# Patient Record
Sex: Female | Born: 1985 | Race: Black or African American | Hispanic: No | Marital: Married | Smoking: Current some day smoker
Health system: Southern US, Community
[De-identification: ages and names within clinical notes are randomized; demographics above are authoritative.]

## PROBLEM LIST (undated history)

## (undated) DIAGNOSIS — I959 Hypotension, unspecified: Secondary | ICD-10-CM

## (undated) DIAGNOSIS — F419 Anxiety disorder, unspecified: Secondary | ICD-10-CM

## (undated) DIAGNOSIS — F3112 Bipolar disorder, current episode manic without psychotic features, moderate: Secondary | ICD-10-CM

## (undated) HISTORY — PX: MANDIBLE FRACTURE SURGERY: SHX706

## (undated) HISTORY — PX: LAPAROSCOPY: SHX197

---

## 2017-09-05 ENCOUNTER — Encounter: Payer: Self-pay | Admitting: Emergency Medicine

## 2017-09-05 ENCOUNTER — Emergency Department
Admission: EM | Admit: 2017-09-05 | Discharge: 2017-09-05 | Disposition: A | Discharge status: Home / Self Care | Payer: Medicaid Other | Attending: Emergency Medicine | Admitting: Emergency Medicine

## 2017-09-05 DIAGNOSIS — Y939 Activity, unspecified: Secondary | ICD-10-CM | POA: Diagnosis not present

## 2017-09-05 DIAGNOSIS — F17200 Nicotine dependence, unspecified, uncomplicated: Secondary | ICD-10-CM | POA: Diagnosis not present

## 2017-09-05 DIAGNOSIS — Y929 Unspecified place or not applicable: Secondary | ICD-10-CM | POA: Insufficient documentation

## 2017-09-05 DIAGNOSIS — M25511 Pain in right shoulder: Secondary | ICD-10-CM | POA: Insufficient documentation

## 2017-09-05 DIAGNOSIS — Y999 Unspecified external cause status: Secondary | ICD-10-CM | POA: Diagnosis not present

## 2017-09-05 MED ORDER — LIDOCAINE 5 % EX PTCH
2.0000 | MEDICATED_PATCH | CUTANEOUS | Status: DC
  Administered 2017-09-05: 2 via TRANSDERMAL
  Filled 2017-09-05 (×2): qty 2
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
## 2018-03-23 DIAGNOSIS — Z3A01 Less than 8 weeks gestation of pregnancy: Secondary | ICD-10-CM | POA: Insufficient documentation

## 2018-03-23 DIAGNOSIS — Z3490 Encounter for supervision of normal pregnancy, unspecified, unspecified trimester: Secondary | ICD-10-CM | POA: Diagnosis not present

## 2018-03-23 DIAGNOSIS — O9989 Other specified diseases and conditions complicating pregnancy, childbirth and the puerperium: Secondary | ICD-10-CM | POA: Insufficient documentation

## 2018-03-23 DIAGNOSIS — R1084 Generalized abdominal pain: Secondary | ICD-10-CM | POA: Insufficient documentation

## 2018-03-24 ENCOUNTER — Emergency Department
Admission: EM | Admit: 2018-03-24 | Discharge: 2018-03-24 | Disposition: A | Discharge status: Home / Self Care | Payer: Medicaid Other | Attending: Emergency Medicine | Admitting: Emergency Medicine

## 2018-03-24 ENCOUNTER — Emergency Department: Payer: Medicaid Other

## 2018-03-24 DIAGNOSIS — R1084 Generalized abdominal pain: Secondary | ICD-10-CM

## 2018-03-24 DIAGNOSIS — Z3490 Encounter for supervision of normal pregnancy, unspecified, unspecified trimester: Secondary | ICD-10-CM

## 2018-03-24 HISTORY — DX: Anxiety disorder, unspecified: F41.9

## 2018-03-24 HISTORY — DX: Hypotension, unspecified: I95.9

## 2018-03-24 HISTORY — DX: Bipolar disorder, current episode manic without psychotic features, moderate: F31.12

## 2018-03-24 LAB — CBC
HEMATOCRIT: 36 % (ref 35.0–47.0)
Hemoglobin: 12.2 g/dL (ref 12.0–16.0)
MCH: 27.7 pg (ref 26.0–34.0)
MCHC: 33.8 g/dL (ref 32.0–36.0)
MCV: 82.1 fL (ref 80.0–100.0)
PLATELETS: 369 10*3/uL (ref 150–440)
RBC: 4.39 MIL/uL (ref 3.80–5.20)
RDW: 17.2 % — AB (ref 11.5–14.5)
WBC: 13.3 10*3/uL — ABNORMAL HIGH (ref 3.6–11.0)

## 2018-03-24 LAB — HCG, QUANTITATIVE, PREGNANCY: HCG, BETA CHAIN, QUANT, S: 31146 m[IU]/mL — AB (ref <5)

## 2018-03-24 LAB — COMPREHENSIVE METABOLIC PANEL
ALBUMIN: 3.8 g/dL (ref 3.5–5.0)
ALT: 22 U/L (ref 14–54)
AST: 27 U/L (ref 15–41)
Alkaline Phosphatase: 63 U/L (ref 38–126)
Anion gap: 9 (ref 5–15)
BUN: 14 mg/dL (ref 6–20)
CHLORIDE: 103 mmol/L (ref 101–111)
CO2: 20 mmol/L — AB (ref 22–32)
CREATININE: 0.85 mg/dL (ref 0.44–1.00)
Calcium: 9.2 mg/dL (ref 8.9–10.3)
GFR calc Af Amer: >60 mL/min (ref >60)
GLUCOSE: 106 mg/dL — AB (ref 65–99)
Potassium: 3.8 mmol/L (ref 3.5–5.1)
Sodium: 132 mmol/L — ABNORMAL LOW (ref 135–145)
TOTAL PROTEIN: 7.5 g/dL (ref 6.5–8.1)
Total Bilirubin: 0.5 mg/dL (ref 0.3–1.2)

## 2018-03-24 LAB — URINALYSIS, COMPLETE (UACMP) WITH MICROSCOPIC
BACTERIA UA: NONE SEEN
Bilirubin Urine: NEGATIVE
Glucose, UA: NEGATIVE mg/dL
Hgb urine dipstick: NEGATIVE
Ketones, ur: NEGATIVE mg/dL
Leukocytes, UA: NEGATIVE
NITRITE: NEGATIVE
Protein, ur: NEGATIVE mg/dL
SPECIFIC GRAVITY, URINE: 1.025 (ref 1.005–1.030)
pH: 6 (ref 5.0–8.0)

## 2018-03-24 LAB — POCT PREGNANCY, URINE: PREG TEST UR: POSITIVE — AB

## 2018-03-24 LAB — LIPASE, BLOOD: LIPASE: 31 U/L (ref 11–51)

## 2018-03-24 NOTE — Discharge Instructions (Signed)
As we discussed, your work-up today was reassuring.  Your ultrasound demonstrated a single intrauterine pregnancy at about 6 weeks and 2 days gestation with no other acute abnormalities identified.  We do not have a specific explanation for the pain you are experiencing or why it feels better when you are up and walking around, but we encourage you to follow-up with your OB/GYN today for an appointment at the next available opportunity.  We provided a CD for you with the ultrasound that you can take to your appointment.  Return to the emergency department if you develop new or worsening symptoms that concern you.

## 2018-03-24 NOTE — ED Triage Notes (Signed)
Patient c/o lower abdominal pain, nausea, and abdominal distention X 2 days. Patient reports she is approx [redacted] weeks pregnant. Patient reports hx of ruptured ectopic tubes.

## 2018-03-24 NOTE — ED Provider Notes (Signed)
[facility] Emergency Department Provider Note  ____________________________________________   First MD Initiated Contact with Patient 03/24/18 0221     (approximate)  I have reviewed the triage vital signs and the nursing notes.   HISTORY  Chief Complaint Abdominal Pain    HPI Kristen Watson is a 32 y.o. female G4P3 at about [redacted] weeks gestation who presents for evaluation of abdominal pain, nausea and subjective abdominal distention for about 2 days.  She reports that she is establishing care with [facility] but because she has a history of ruptured tube due to ectopic pregnancy they told her by phone today that she should come to the emergency department to be evaluated.  She reports that the pain is severe and generalized and waxes and wanes in intensity.  It feels somewhat like a cramp but she is also worried about the abdominal distention.  She feels better when she ambulates and all throughout the history and physical the patient is pacing back and forth in the exam room.  She states that sitting or lying down makes it feel worse.  It is not related to eating.  She has had nausea but no vomiting, denies dysuria, denies chest pain or shortness of breath.  She had similar but much milder symptoms with her other pregnancies.  She reports it has been gradual in onset but is currently severe although as stated above, ambulation makes the symptoms tolerable.  She is taken some children's Tylenol but states that it did not make it feel better.  She has had no vaginal bleeding.   Past Medical History:  Diagnosis Date  . Anxiety   . Bipolar 1 disorder with moderate mania (HCC)   . Hypotension     There are no active problems to display for this patient.   Past Surgical History:  Procedure Laterality Date  . LAPAROSCOPY    . MANDIBLE FRACTURE SURGERY      Prior to Admission medications   Not on File    Allergies Patient has no known allergies.  No  family history on file.  Social History Social History   Tobacco Use  . Smoking status: Current Some Day Smoker  . Smokeless tobacco: Never Used  Substance Use Topics  . Alcohol use: Yes  . Drug use: No    Review of Systems Constitutional: No fever/chills Eyes: No visual changes. ENT: No sore throat. Cardiovascular: Denies chest pain. Respiratory: Denies shortness of breath. Gastrointestinal: Generalized abdominal pain and distention as described above Genitourinary: Negative for dysuria.  No vaginal bleeding Musculoskeletal: Negative for neck pain.  Negative for back pain. Integumentary: Negative for rash. Neurological: Negative for headaches, focal weakness or numbness.   ____________________________________________   PHYSICAL EXAM:  VITAL SIGNS: ED Triage Vitals  Enc Vitals Group     BP 03/24/18 0001 (!) 145/73     Pulse Rate 03/24/18 0001 (!) 107     Resp 03/24/18 0001 18     Temp 03/24/18 0001 98.7 F (37.1 C)     Temp Source 03/24/18 0001 Oral     SpO2 03/24/18 0001 99 %     Weight 03/24/18 0001 (!) 136.8 kg (301 lb 9.4 oz)     Height 03/24/18 0001 1.753 m ( )     Head Circumference --      Peak Flow --      Pain Score 03/24/18 0011 8     Pain Loc --      Pain Edu? --  Excl. in GC? --     Constitutional: Alert and oriented.  Generally well-appearing but anxious and pacing the room Eyes: Conjunctivae are normal.  Head: Atraumatic. Nose: No congestion/rhinnorhea. Mouth/Throat: Mucous membranes are moist. Neck: No stridor.  No meningeal signs.   Cardiovascular: Normal rate, regular rhythm. Good peripheral circulation. Grossly normal heart sounds. Respiratory: Normal respiratory effort.  No retractions. Lungs CTAB. Gastrointestinal: Obese.  Soft with some tenderness to palpation just above the umbilicus but no palpable hernia.  No rebound and no guarding Genitourinary: Deferred Musculoskeletal: No lower extremity tenderness nor edema. No gross  deformities of extremities. Neurologic:  Normal speech and language. No gross focal neurologic deficits are appreciated.  Skin:  Skin is warm, dry and intact. No rash noted.  ____________________________________________   LABS (all labs ordered are listed, but only abnormal results are displayed)  Labs Reviewed  COMPREHENSIVE METABOLIC PANEL - Abnormal; Notable for the following components:      Result Value   Sodium 132 (*)    CO2 20 (*)    Glucose, Bld 106 (*)    All other components within normal limits  CBC - Abnormal; Notable for the following components:   WBC 13.3 (*)    RDW 17.2 (*)    All other components within normal limits  URINALYSIS, COMPLETE (UACMP) WITH MICROSCOPIC - Abnormal; Notable for the following components:   Color, Urine YELLOW (*)    APPearance CLEAR (*)    All other components within normal limits  HCG, QUANTITATIVE, PREGNANCY - Abnormal; Notable for the following components:   hCG, Beta Chain, Quant, S 31,146 (*)    All other components within normal limits  POCT PREGNANCY, URINE - Abnormal; Notable for the following components:   Preg Test, Ur POSITIVE (*)    All other components within normal limits  LIPASE, BLOOD  POC URINE PREG, ED   ____________________________________________  EKG  None - EKG not ordered by ED physician ____________________________________________  RADIOLOGY   ED MD interpretation: Radiologist visualizes a single intrauterine pregnancy at about 6 weeks and 2 days gestation with no other acute abnormalities identified  Official radiology report(s): US Ob Comp Less 14 Wks  Result Date: 03/24/2018 CLINICAL DATA:  Lower abdominal pain for 2 days EXAM: OBSTETRIC <14 WK [place] AND TRANSVAGINAL OB US TECHNIQUE: Both transabdominal and transvaginal ultrasound examinations were performed for complete evaluation of the gestation as well as the maternal uterus, adnexal regions, and pelvic cul-de-sac. Transvaginal technique was  performed to assess early pregnancy. COMPARISON:  None. FINDINGS: Intrauterine gestational sac: Single intrauterine gestational sac Yolk sac:  Visible Embryo:  Visible Cardiac Activity: Visible Heart Rate: 121 bpm CRL: 5.3 mm   6 w   2 d                  [place] EDC: 11/15/2018 Subchorionic hemorrhage:  None visualized. Maternal uterus/adnexae: Left ovary is within normal limits and measures 4.3 x 2.2 x 3 cm. The right ovary measures 4.1 x 2.6 x 2.3 cm. Hypoechoic cyst with echogenic rim in the right ovary measuring 2 cm, probable corpus luteal cyst. No significant free fluid. IMPRESSION: Single viable intrauterine pregnancy as above. No other specific abnormality is seen Electronically Signed   By: Jasmine Pang M.D.   On: 03/24/2018 01:46   US Ob Transvaginal  Result Date: 03/24/2018 CLINICAL DATA:  Lower abdominal pain for 2 days EXAM: OBSTETRIC <14 WK [place] AND TRANSVAGINAL OB US TECHNIQUE: Both transabdominal and transvaginal ultrasound examinations were performed for  complete evaluation of the gestation as well as the maternal uterus, adnexal regions, and pelvic cul-de-sac. Transvaginal technique was performed to assess early pregnancy. COMPARISON:  None. FINDINGS: Intrauterine gestational sac: Single intrauterine gestational sac Yolk sac:  Visible Embryo:  Visible Cardiac Activity: Visible Heart Rate: 121 bpm CRL: 5.3 mm   6 w   2 d                  [place] EDC: 11/15/2018 Subchorionic hemorrhage:  None visualized. Maternal uterus/adnexae: Left ovary is within normal limits and measures 4.3 x 2.2 x 3 cm. The right ovary measures 4.1 x 2.6 x 2.3 cm. Hypoechoic cyst with echogenic rim in the right ovary measuring 2 cm, probable corpus luteal cyst. No significant free fluid. IMPRESSION: Single viable intrauterine pregnancy as above. No other specific abnormality is seen Electronically Signed   By: Jasmine Pang M.D.   On: 03/24/2018 01:46    ____________________________________________   PROCEDURES  Critical Care  performed: No   Procedure(s) performed:   Procedures   ____________________________________________   INITIAL IMPRESSION / ASSESSMENT AND PLAN / ED COURSE  As part of my medical decision making, I reviewed the following data within the electronic MEDICAL RECORD NUMBER Nursing notes reviewed and incorporated, Labs reviewed  and Old chart reviewed    Differential diagnosis includes but is not limited to any standard cause of intra-abdominal pain such as appendicitis, biliary colic, renal colic, diverticulitis, etc., as well as pregnancy related pain such as round ligament pain, ectopic pregnancy,  Spontaneous abortion, etc.  The fact that the patient feels much better when she is up moving around is somewhat puzzling to me but in my mind is reassuring because acute intra-abdominal infections should not be relieved with additional movement and ambulation.  Her pain is very localized to an area just above her umbilicus and I think unlikely to be the result of renal colic/kidney stones, and her urinalysis is reassuring with no acute abnormalities.  Her lab work in general is reassuring with a normal metabolic panel except for very slightly low sodium and she has a mild leukocytosis of about 13 but this is nonspecific particularly in pregnancy.  Vital signs are normal and she is not tachycardic during the time I evaluated her.  She was reassured by the ultrasound report and is comfortable following up with OB/GYN later today.  I provided a CD with her ultrasound on it so she could take it to her appointment.  I gave my usual and customary return precautions.  There is no indication for further imaging tonight and she states that she would rather go home and follow-up as an outpatient which I think is appropriate.    ____________________________________________  FINAL CLINICAL IMPRESSION(S) / ED DIAGNOSES  Final diagnoses:  Generalized abdominal pain  Pregnancy at early stage     MEDICATIONS  GIVEN DURING THIS VISIT:  Medications - No data to display   ED Discharge Orders    None       Note:  This document was prepared using Dragon voice recognition software and may include unintentional dictation errors.    Loleta Rose, MD 03/24/18 [phone]

## 2019-11-12 IMAGING — US US OB TRANSVAGINAL
1 series · 14 of 28 positions shown · non-contrast
Comparison: None.

CLINICAL DATA: Lower abdominal pain for 2 days

EXAM:
OBSTETRIC <14 WK US AND TRANSVAGINAL OB US
TECHNIQUE: Both transabdominal and transvaginal ultrasound examinations were
performed for complete evaluation of the gestation as well as the
maternal uterus, adnexal regions, and pelvic cul-de-sac.
Transvaginal technique was performed to assess early pregnancy.

[Series 1: us ob transvaginal · 0.28mm/px · 14 of 111 slices shown]
[im 5/111]
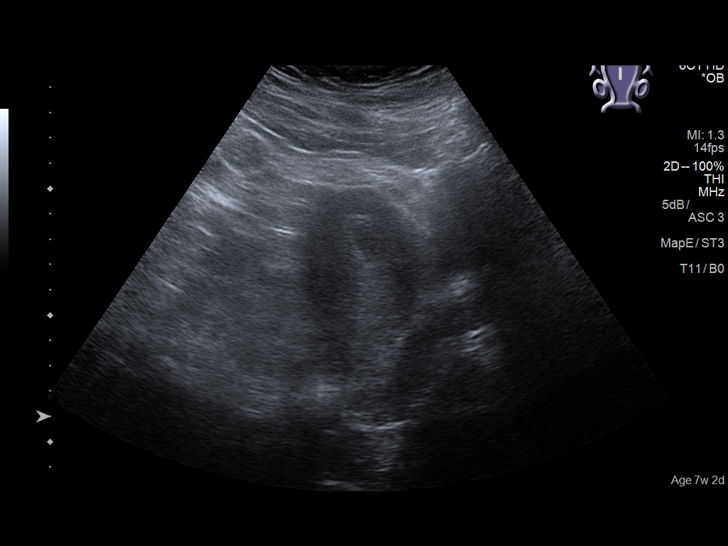
[im 13/111]
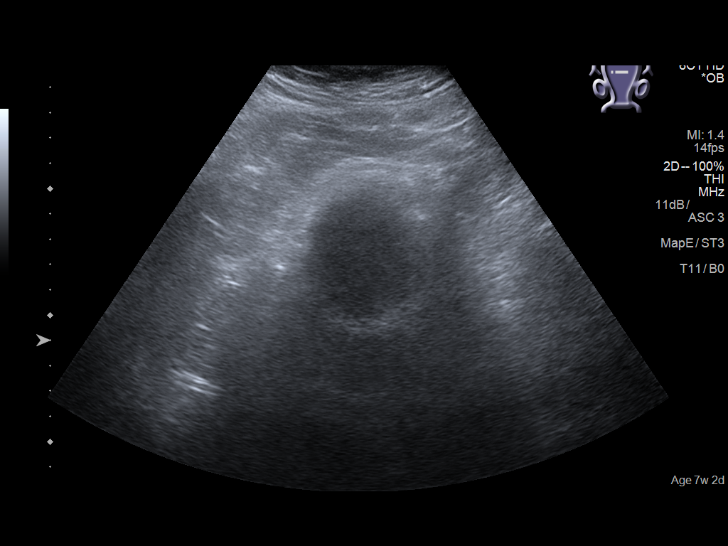
[im 21/111]
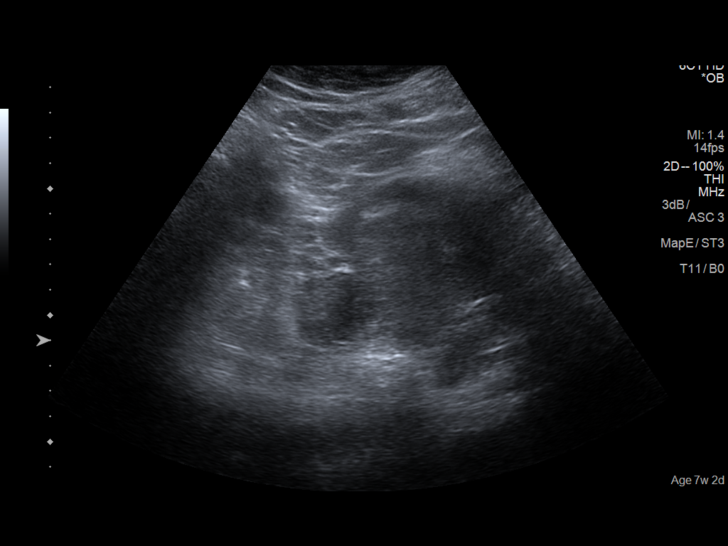
[im 29/111]
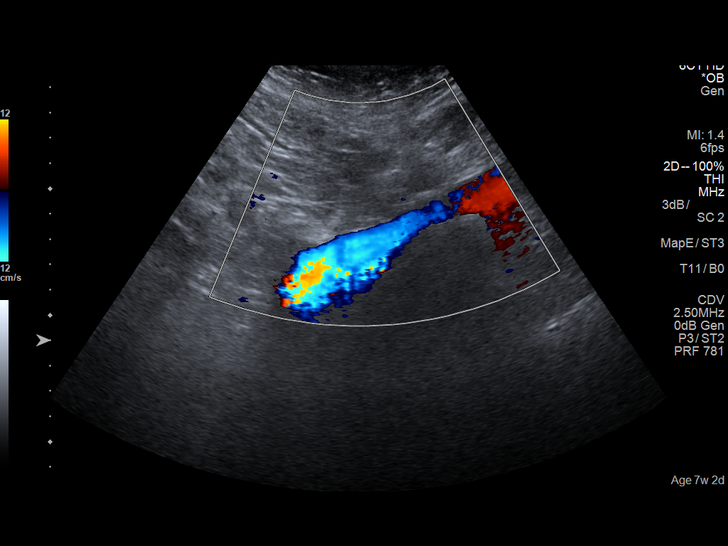
[im 37/111]
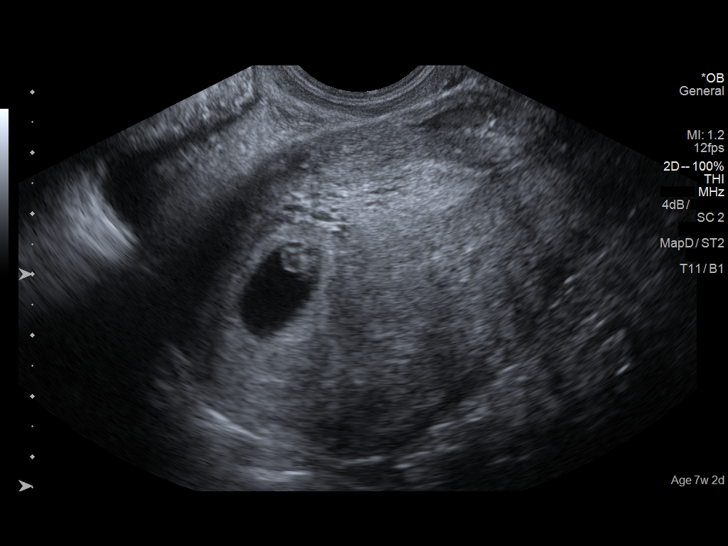
[im 45/111]
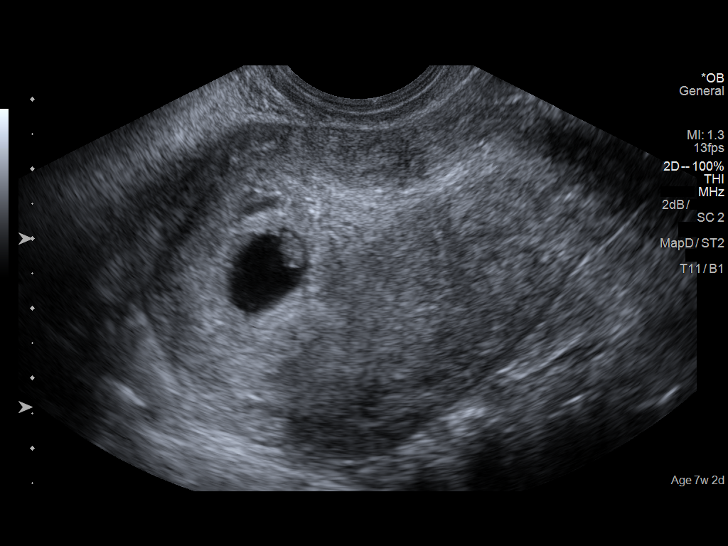
[im 53/111]
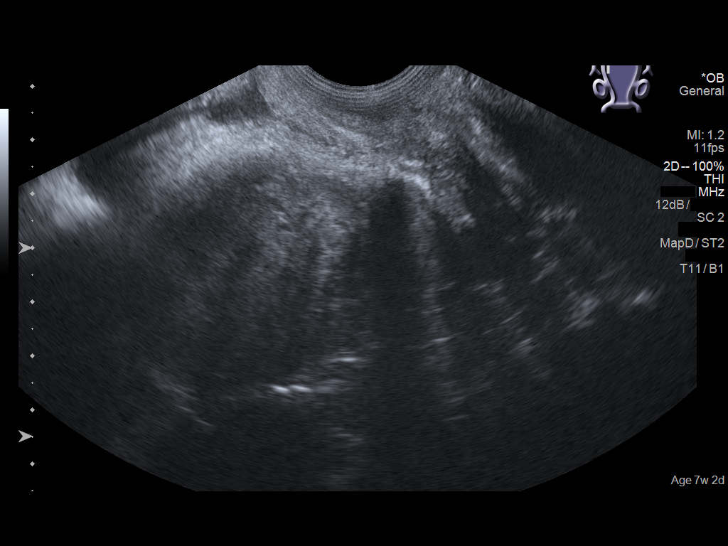
[im 62/111]
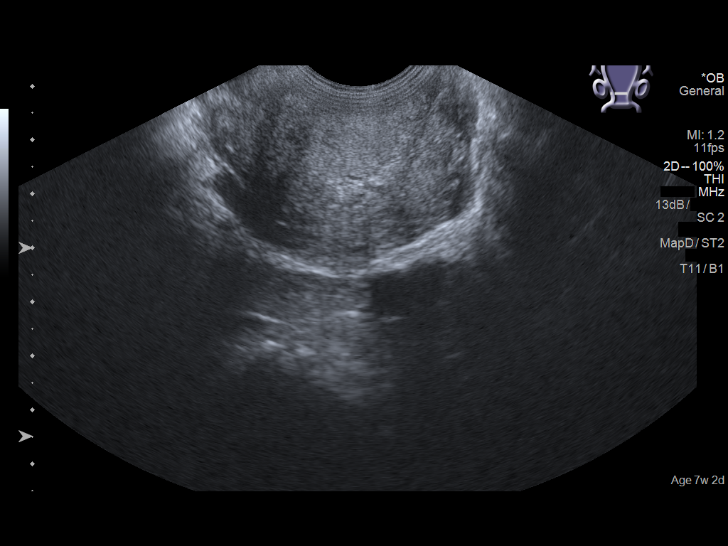
[im 70/111]
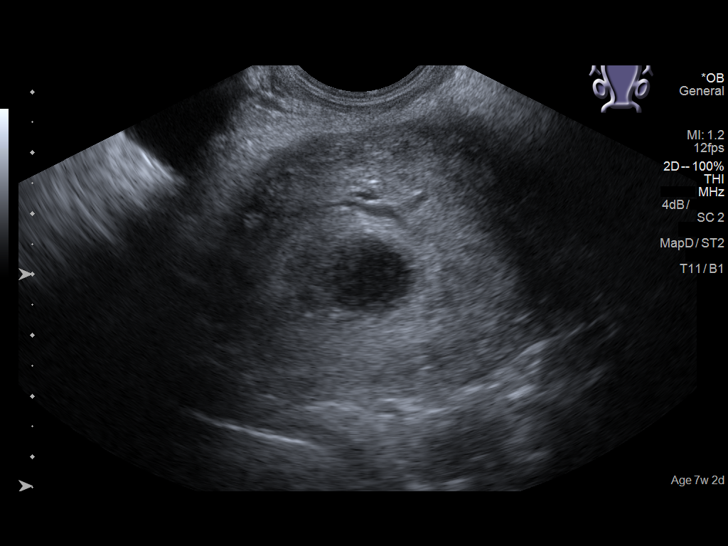
[im 78/111]
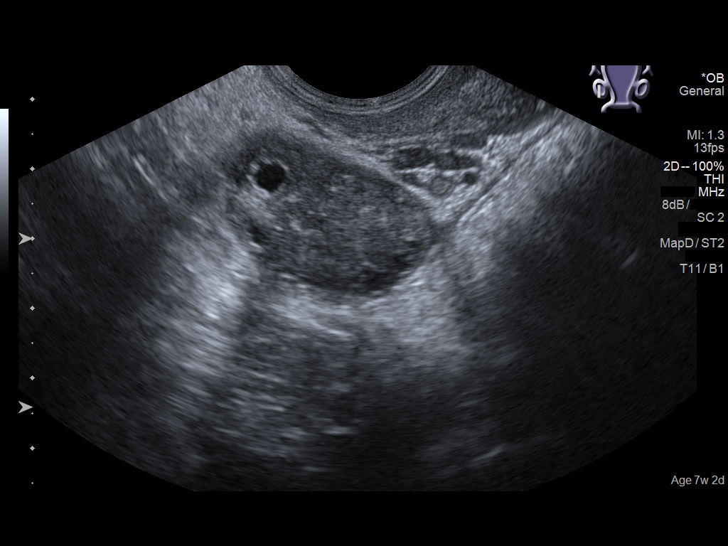
[im 86/111]
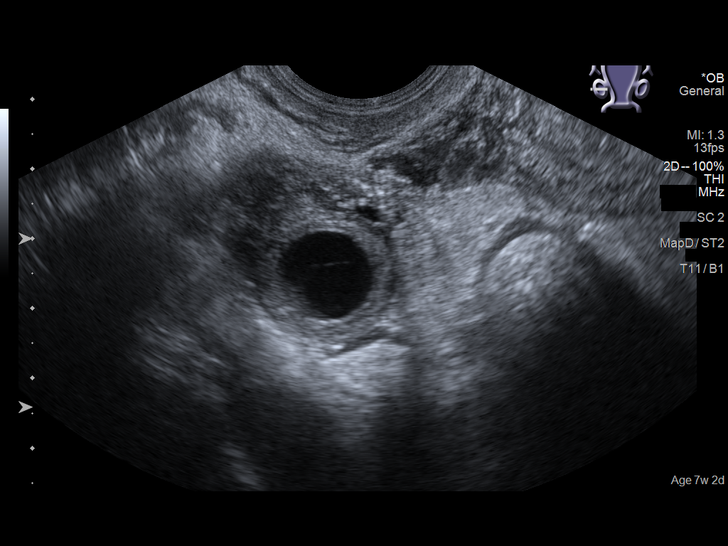
[im 94/111]
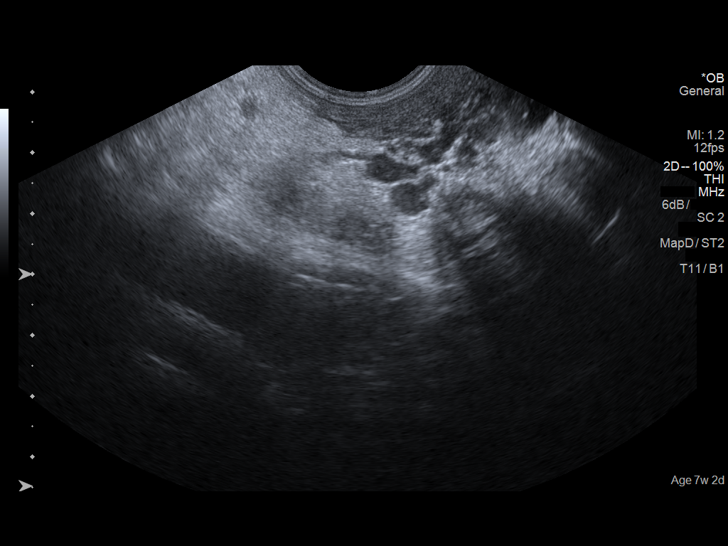
[im 102/111]
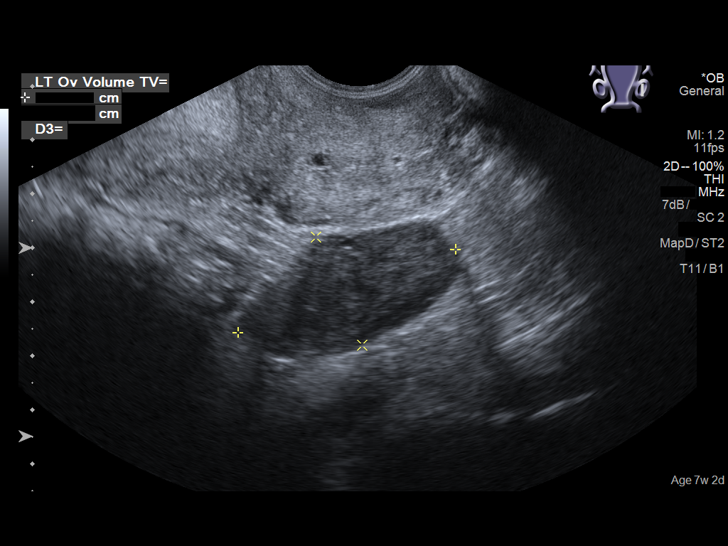
[im 111/111]
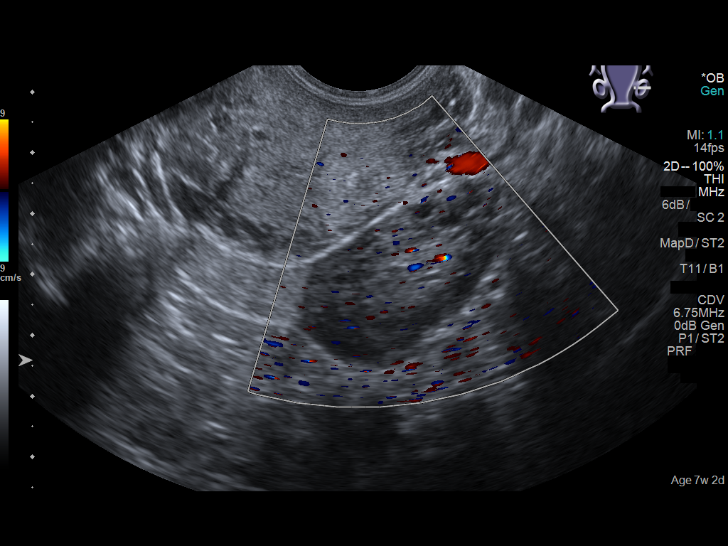

[14 of 28 positions shown; findings below may reference images not displayed]

FINDINGS: Intrauterine gestational sac: Single intrauterine gestational sac

Yolk sac:  Visible

Embryo:  Visible

Cardiac Activity: Visible

Heart Rate: 121 bpm

CRL: 5.3 mm   6 w   2 d                  US EDC: 11/15/2018

Subchorionic hemorrhage:  None visualized.

Maternal uterus/adnexae: Left ovary is within normal limits and
measures 4.3 x 2.2 x 3 cm. The right ovary measures 4.1 x 2.6 x
cm. Hypoechoic cyst with echogenic rim in the right ovary measuring
2 cm, probable corpus luteal cyst. No significant free fluid.
IMPRESSION: Single viable intrauterine pregnancy as above. No other specific
abnormality is seen
